# Patient Record
Sex: Male | Born: 1980 | Hispanic: Yes | Marital: Single | Smoking: Never smoker
Health system: Southern US, Community
[De-identification: ages and names within clinical notes are randomized; demographics above are authoritative.]

## PROBLEM LIST (undated history)

## (undated) DIAGNOSIS — E039 Hypothyroidism, unspecified: Secondary | ICD-10-CM

---

## 2011-09-05 ENCOUNTER — Emergency Department (HOSPITAL_COMMUNITY)
Admission: EM | Admit: 2011-09-05 | Discharge: 2011-09-05 | Disposition: A | Discharge status: Home / Self Care | Payer: Self-pay | Attending: Emergency Medicine | Admitting: Emergency Medicine

## 2011-09-05 ENCOUNTER — Encounter (HOSPITAL_COMMUNITY): Payer: Self-pay | Admitting: *Deleted

## 2011-09-05 DIAGNOSIS — E039 Hypothyroidism, unspecified: Secondary | ICD-10-CM | POA: Insufficient documentation

## 2011-09-05 DIAGNOSIS — T360X5A Adverse effect of penicillins, initial encounter: Secondary | ICD-10-CM | POA: Insufficient documentation

## 2011-09-05 DIAGNOSIS — R112 Nausea with vomiting, unspecified: Secondary | ICD-10-CM | POA: Insufficient documentation

## 2011-09-05 DIAGNOSIS — T887XXA Unspecified adverse effect of drug or medicament, initial encounter: Secondary | ICD-10-CM | POA: Insufficient documentation

## 2011-09-05 HISTORY — DX: Hypothyroidism, unspecified: E03.9

## 2011-09-05 MED ORDER — GI COCKTAIL ~~LOC~~
30.0000 mL | Freq: Once | ORAL | Status: AC
  Administered 2011-09-05: 30 mL via ORAL
  Filled 2011-09-05: qty 30

## 2011-09-05 MED ORDER — ONDANSETRON 4 MG PO TBDP
8.0000 mg | ORAL_TABLET | Freq: Once | ORAL | Status: AC
  Administered 2011-09-05: 8 mg via ORAL
  Filled 2011-09-05: qty 2

## 2011-09-05 MED ORDER — ACETAMINOPHEN 325 MG PO TABS
650.0000 mg | ORAL_TABLET | Freq: Once | ORAL | Status: AC
  Administered 2011-09-05: 650 mg via ORAL
  Filled 2011-09-05: qty 2

## 2011-09-05 MED ORDER — CLINDAMYCIN HCL 300 MG PO CAPS: 300.0000 mg | ORAL_CAPSULE | Freq: Four times a day (QID) | ORAL | Status: AC

## 2011-09-05 NOTE — Discharge Instructions (Signed)
Please followup with your doctor for continued outpatient treatment. Stop taking your penicillin and begin taking your new antibiotic, clindamycin.   Reaccin a las drogas, Programme researcher, broadcasting/film/video (Drug Reaction, GI Intolerance) El Programme researcher, broadcasting/film/video que siente podra deberse a los medicamentos que est consumiendo. A menudo el malestar estomacal por medicamentos se debe a una irritacin del [place]. Esto no suele ser Runner, broadcasting/film/video.  CAUSAS Algunos medicamentos pueden causar reacciones. Esto puede ser una erupcin leve, nuseas y vmitos o una reaccin que puede ser fatal denominada anafilaxis. La anafilaxis involucra a todo el cuerpo.  INSTRUCCIONES PARA EL CUIDADO DOMICILIARIO  Si el nico problema que tiene es Programme researcher, broadcasting/film/video, tomar la medicacin junto con algn alimento o algo en el estmago har que el problema desaparezca. Si esta solucin no funciona, deber ponerse en contacto con el mdico. Puede ser necesario cambiar la medicacin.   Esto no funcionar si la medicacin debe tomarse con el estmago vaco.  Romona Curls ATENCIN MDICA SI:  Desarrolla otros problemas que empeoran en vez de mejorar.   Desarrolla nuevos sntomas.   Reaparecen los sntomas que lo llevaron a la consulta con el profesional o la sala de Sports administrator.  SOLICITE ATENCIN MDICA INMEDIATAMENTE SI:  Presenta dificultad para respirar, jadea o tiene una sensacin de opresin en el pecho o en la garganta.   Tiene la boca hinchada, o presenta urticaria, hinchazn o picazn en todo el cuerpo. ESTO ES UNA EMERGENCIA LLAME AL 911.   Presenta diarrea o vmitos.   Se marea o pierde el conocimiento  ESTO ES UNA EMERGENCIA! LOS FAMILIARES O EL MDICO DEBEN LLAMAR AL 911.  EST SEGURO QUE:   Comprende las instrucciones para el alta mdica.   Controlar su enfermedad.   Solicitar atencin mdica de inmediato segn las indicaciones.  Document Released: 08/09/2008 Document Revised: 05/02/2011 [facility]  Patient Information 2012 [place], [place].

## 2011-09-05 NOTE — ED Provider Notes (Signed)
History     CSN: [id number]  Arrival date & time 09/05/11  2154   First MD Initiated Contact with Patient 09/05/11 2251      Chief Complaint  Patient presents with  . Emesis    HPI  History provided by the patient through a Spanish interpreter. Patient is a 31 year old Hispanic male with history of hypothyroidism who presents with complaints of nausea vomiting after taking penicillin. Patient reports being diagnosed with a throat infection 2 days ago. He was started on penicillin for this infection. He reports taking doses this morning shortly after feeling dizziness with nausea vomiting episode. Patient continues to feel a pressure in his chest and epigastric area. He reports symptoms are improving slowly. He denies any rash or itching. No swelling of the throat or shortness of breath. He denies any other complaints. Symptoms are described as moderate. There are no other aggravating or alleviating factors.    Past Medical History  Diagnosis Date  . Hypothyroidism     History reviewed. No pertinent past surgical history.  History reviewed. No pertinent family history.  History  Substance Use Topics  . Smoking status: Never Smoker   . Smokeless tobacco: Not on file  . Alcohol Use: No      Review of Systems  Constitutional: Positive for fever and chills. Negative for appetite change.  HENT: Positive for sore throat. Negative for congestion and rhinorrhea.   Respiratory: Negative for cough and shortness of breath.   Gastrointestinal: Positive for nausea and vomiting. Negative for abdominal pain.  Musculoskeletal: Negative for myalgias.  Skin: Negative for rash.    Allergies  Penicillins  Home Medications   Current Outpatient Rx  Name Route Sig Dispense Refill  . FA-PYRIDOXINE-CYANCOBALAMIN 2.5-25-2 MG PO TABS Oral Take 1 tablet by mouth daily.    Marland Kitchen LEVOTHYROXINE SODIUM 100 MCG PO TABS Oral Take 100 mcg by mouth daily.    Marland Kitchen PENICILLIN V POTASSIUM 500 MG PO TABS Oral  Take 500 mg by mouth 2 (two) times daily.      BP 113/77  Pulse 70  Temp(Src) 98.5 F (36.9 C) (Oral)  Resp 20  SpO2 94%  Physical Exam  Nursing note and vitals reviewed. Constitutional: He is oriented to person, place, and time. He appears well-developed and well-nourished. No distress.  HENT:  Head: Normocephalic and atraumatic.       Mild enlargement of bilateral tonsils with erythema. Uvula midline. No signs for PTA.  Neck: Normal range of motion. Neck supple.  Cardiovascular: Normal rate and regular rhythm.   Pulmonary/Chest: Effort normal and breath sounds normal. No respiratory distress. He has no rales.  Abdominal: Soft. He exhibits no distension. There is no tenderness. There is no rebound and no guarding.  Lymphadenopathy:    He has cervical adenopathy.  Neurological: He is alert and oriented to person, place, and time.  Skin: Skin is warm. No rash noted.  Psychiatric: He has a normal mood and affect. His behavior is normal.    ED Course  Procedures      1. Medication side effects   2. Nausea & vomiting       MDM  10:50 PM patient seen and evaluated. Patient in no acute distress.        Angus Seller, [place] 09/06/11 [phone]

## 2011-09-05 NOTE — ED Notes (Signed)
Assumed care of pt.  Pt denies Nausea at this time.  Reports that his throat is hurting a little.  No distress noted.  Family remains at bedside.

## 2011-09-05 NOTE — ED Notes (Signed)
Patient has started new meds for infection in his throat.  Pt had vomiting after medications.  No vomiting at this time.

## 2011-09-09 NOTE — ED Provider Notes (Signed)
Medical screening examination/treatment/procedure(s) were performed by non-physician practitioner and as supervising physician I was immediately available for consultation/collaboration.   Gwyneth Sprout, MD 09/09/11 2119

## 2013-07-18 ENCOUNTER — Ambulatory Visit: Payer: Self-pay | Admitting: Internal Medicine

## 2013-07-18 VITALS — BP 122/78 | HR 118 | Temp 98.9°F | Resp 18 | Ht 64.0 in | Wt 155.6 lb

## 2013-07-18 DIAGNOSIS — K529 Noninfective gastroenteritis and colitis, unspecified: Secondary | ICD-10-CM

## 2013-07-18 DIAGNOSIS — E039 Hypothyroidism, unspecified: Secondary | ICD-10-CM

## 2013-07-18 DIAGNOSIS — K5289 Other specified noninfective gastroenteritis and colitis: Secondary | ICD-10-CM

## 2013-07-18 DIAGNOSIS — A088 Other specified intestinal infections: Secondary | ICD-10-CM

## 2013-07-18 MED ORDER — DICYCLOMINE HCL 20 MG PO TABS: 20.0000 mg | ORAL_TABLET | Freq: Four times a day (QID) | ORAL | Status: DC

## 2013-07-18 MED ORDER — ONDANSETRON HCL 4 MG PO TABS: 4.0000 mg | ORAL_TABLET | Freq: Three times a day (TID) | ORAL | Status: AC | PRN

## 2013-07-18 MED ORDER — DICYCLOMINE HCL 20 MG PO TABS: 20.0000 mg | ORAL_TABLET | Freq: Four times a day (QID) | ORAL | Status: AC

## 2013-07-18 MED ORDER — ONDANSETRON HCL 4 MG PO TABS: 8.0000 mg | ORAL_TABLET | Freq: Once | ORAL | Status: AC

## 2013-07-19 DIAGNOSIS — E039 Hypothyroidism, unspecified: Secondary | ICD-10-CM | POA: Insufficient documentation

## 2013-07-19 NOTE — Progress Notes (Signed)
   Subjective:    Patient ID: Jonathan Johns, male    DOB: 12/29/1980, 33 y.o.   MRN: [id number]  HPI Sudden onset of nausea vomiting and diarrhea last night Had chills and fever as well but none this afternoon Continues to be nauseated and is unable to take much in today but no vomiting this afternoon Has continued diarrhea today Has some stomach cramping before the diarrhea but then the pain resolves No upper respiratory symptoms  Meds for hypothyroidism Review of Systems Noncontributory    Objective:   Physical Exam BP 122/78  Pulse 118  Temp(Src) 98.9 F (37.2 C) (Oral)  Resp 18  Ht 5\' 4"  (1.626 m)  Wt 155 lb 9.6 oz (70.58 kg)  BMI 26.70 kg/m2  SpO2 96% No acute distress HEENT clear Heart regular Lungs clear Abdomen soft with markedly increased bowel sounds No hepatomegaly or splenomegaly No masses Mild tenderness in the epigastrium and left lower quadrant Exam of the epigastrium caused him to vomit   Given Zofran 8 mg sublingual     Assessment & Plan:  Problem #1 gastroenteritis-  Plan Advance liquids in small amounts when nausea resolved by medication Continue Zofran every 8 hours as needed Use dicyclomine for cramping when necessary 4 times a day

## 2014-07-08 ENCOUNTER — Encounter (HOSPITAL_COMMUNITY): Payer: Self-pay

## 2014-07-08 ENCOUNTER — Telehealth: Payer: Self-pay | Admitting: *Deleted

## 2014-07-08 ENCOUNTER — Emergency Department (HOSPITAL_COMMUNITY)
Admission: EM | Admit: 2014-07-08 | Discharge: 2014-07-08 | Disposition: A | Discharge status: Home / Self Care | Payer: Self-pay | Attending: Emergency Medicine | Admitting: Emergency Medicine

## 2014-07-08 DIAGNOSIS — Z880 Allergy status to penicillin: Secondary | ICD-10-CM | POA: Insufficient documentation

## 2014-07-08 DIAGNOSIS — E039 Hypothyroidism, unspecified: Secondary | ICD-10-CM | POA: Insufficient documentation

## 2014-07-08 DIAGNOSIS — J029 Acute pharyngitis, unspecified: Secondary | ICD-10-CM | POA: Insufficient documentation

## 2014-07-08 DIAGNOSIS — Z79899 Other long term (current) drug therapy: Secondary | ICD-10-CM | POA: Insufficient documentation

## 2014-07-08 LAB — RAPID STREP SCREEN (MED CTR MEBANE ONLY): STREPTOCOCCUS, GROUP A SCREEN (DIRECT): NEGATIVE

## 2014-07-08 MED ORDER — DEXAMETHASONE 4 MG PO TABS
12.0000 mg | ORAL_TABLET | Freq: Once | ORAL | Status: AC
  Administered 2014-07-08: 12 mg via ORAL
  Filled 2014-07-08: qty 3

## 2014-07-08 MED ORDER — MAGIC MOUTHWASH
10.0000 mL | Freq: Once | ORAL | Status: AC
  Administered 2014-07-08: 10 mL via ORAL
  Filled 2014-07-08: qty 10

## 2014-07-08 MED ORDER — MAGIC MOUTHWASH: 5.0000 mL | Freq: Four times a day (QID) | ORAL | Status: AC | PRN

## 2014-07-08 NOTE — Telephone Encounter (Signed)
Pharmacy called for Magic Mouthwash concentration.

## 2014-07-08 NOTE — ED Provider Notes (Signed)
CSN: [id number]     Arrival date & time 07/08/14  0417 History   First MD Initiated Contact with Patient 07/08/14 [phone]     Chief Complaint  Patient presents with  . Sore Throat     (Consider location/radiation/quality/duration/timing/severity/associated sxs/prior Treatment) HPI Jonathan Johns is a 34 year-old male with pmhx of hypothyroidism who presents to the ER c/o sore throat.  Patient states his symptoms began 3 days ago, and have persisted. Patient states he was seen in an urgent care yesterday, prescribed Toradol and Sudafed, and these have not relieved his symptoms. He states he feels his throat is swelling, and he is having difficulty swallowing food due to the sensation of swelling. Patient denies nausea, vomiting, fever, shortness of breath.   Past Medical History  Diagnosis Date  . Hypothyroidism    History reviewed. No pertinent past surgical history. No family history on file. History  Substance Use Topics  . Smoking status: Never Smoker   . Smokeless tobacco: Not on file  . Alcohol Use: No    Review of Systems  Constitutional: Negative for fever.  HENT: Positive for sore throat and trouble swallowing.   Eyes: Negative for visual disturbance.  Respiratory: Negative for shortness of breath.   Cardiovascular: Negative for chest pain.  Gastrointestinal: Negative for nausea, vomiting and abdominal pain.  Genitourinary: Negative for dysuria.  Skin: Negative for rash.  Neurological: Negative for dizziness, syncope, weakness and numbness.  Psychiatric/Behavioral: Negative.       Allergies  Sulfa antibiotics and Penicillins  Home Medications   Prior to Admission medications   Medication Sig Start Date End Date Taking? Authorizing Provider  ketorolac (TORADOL) 10 MG tablet Take 10 mg by mouth every 6 (six) hours as needed for moderate pain.   Yes Historical Provider, MD  pseudoephedrine (SUDAFED) 120 MG 12 hr tablet Take 120 mg by mouth every 12 (twelve) hours  as needed for congestion.   Yes Historical Provider, MD  Alum & Mag Hydroxide-Simeth (MAGIC MOUTHWASH) SOLN Take 5 mLs by mouth 4 (four) times daily as needed for mouth pain. 07/08/14   Monte Fantasia, PA-C  dicyclomine (BENTYL) 20 MG tablet Take 1 tablet (20 mg total) by mouth every 6 (six) hours. As needed for cramping Patient not taking: Reported on 07/08/2014 07/18/13   Tonye Pearson, MD  folic acid-pyridoxine-cyancobalamin (FOLTX) 2.5-25-2 MG TABS Take 1 tablet by mouth daily.    Historical Provider, MD  levothyroxine (SYNTHROID, LEVOTHROID) 100 MCG tablet Take 100 mcg by mouth daily.    Historical Provider, MD  ondansetron (ZOFRAN) 4 MG tablet Take 1 tablet (4 mg total) by mouth every 8 (eight) hours as needed for nausea or vomiting. Patient not taking: Reported on 07/08/2014 07/18/13   Tonye Pearson, MD   BP 115/81 mmHg  Pulse 93  Temp(Src) 97.7 F (36.5 C) (Oral)  Resp 18  Ht  (1.676 m)  Wt 152 lb (68.947 kg)  BMI 24.55 kg/m2  SpO2 98% Physical Exam  Constitutional: He is oriented to person, place, and time. He appears well-developed and well-nourished. No distress.  HENT:  Head: Normocephalic and atraumatic.  Mouth/Throat: Uvula is midline and mucous membranes are normal. No trismus in the jaw. No uvula swelling. Posterior oropharyngeal erythema present. No oropharyngeal exudate, posterior oropharyngeal edema or tonsillar abscesses.  Mild erythema noted to posterior oropharynx. No tonsillar swelling or exudate noted. Uvula midline.no trismus.no anterior cervical lymphadenopathy or pain with movement of trachea. Patient has full range of motion  of his neck without stiffness or discomfort.  Eyes: Right eye exhibits no discharge. Left eye exhibits no discharge. No scleral icterus.  Neck: Normal range of motion.  Pulmonary/Chest: Effort normal. No accessory muscle usage. No tachypnea. No respiratory distress.  Musculoskeletal: Normal range of motion.  Neurological: He is  alert and oriented to person, place, and time.  Skin: Skin is warm and dry. He is not diaphoretic.  Psychiatric: He has a normal mood and affect.  Nursing note and vitals reviewed.   ED Course  Procedures (including critical care time) Labs Review Labs Reviewed  RAPID STREP SCREEN  CULTURE, GROUP A STREP    Imaging Review No results found.   EKG Interpretation None      MDM   Final diagnoses:  Pharyngitis    Patients symptoms are consistent with pharyngitis, likely viral etiology. On exam, no concern for PTA or retropharyngeal abscess, no concern for epiglottitis. Patient nontoxic, non-tachycardic, non-tachypneic, non-hypoxic, afebrile, well-appearing and in no acute distress. Patient able to tolerate by mouth liquids in the ER without difficulty and treated with Decadron in the ER to help reduce swelling. Discussed that antibiotics are not indicated for viral infections. Patient instructed to follow up with his primary care physician.Pt will be discharged with symptomatic treatment.  Verbalizes understanding and is agreeable with plan. Pt is hemodynamically stable & in NAD prior to dc.return precautions discussed, and patient encouraged to call or return to the ER should he have any questions or concerns.  BP 115/81 mmHg  Pulse 93  Temp(Src) 97.7 F (36.5 C) (Oral)  Resp 18  Ht 5\' 6"  (1.676 m)  Wt 152 lb (68.947 kg)  BMI 24.55 kg/m2  SpO2 98%  Signed,  Ladona MowJoe Britny Riel, PA-C 8:31 AM  Patient seen and discussed with Dr. Cy BlamerApril Palumbo, M.D.   Monte FantasiaJoseph W Lexianna Weinrich, PA-C 07/08/14 0831  April K Palumbo-Rasch, MD 07/08/14 [phone]

## 2014-07-08 NOTE — Discharge Instructions (Signed)
Faringitis (Pharyngitis) La faringitis ocurre cuando la faringe presenta enrojecimiento, dolor e hinchazn (inflamacin).  CAUSAS  Normalmente, la faringitis se debe a una infeccin. Generalmente, estas infecciones ocurren debido a virus (viral) y se presentan cuando las personas se resfran. Sin embargo, a veces la faringitis es provocada por bacterias (bacteriana). Las alergias tambin pueden ser una causa de la faringitis. La faringitis viral se puede contagiar de una persona a otra al toser, estornudar y compartir objetos o utensilios personales (tazas, tenedores, cucharas, cepillos de diente). La faringitis bacteriana se puede contagiar de una persona a otra a travs de un contacto ms ntimo, como besar.  SIGNOS Y SNTOMAS  Los sntomas de la faringitis incluyen los siguientes:   Dolor de garganta.  Cansancio (fatiga).  Fiebre no muy elevada.  Dolor de cabeza.  Dolores musculares y en las articulaciones.  Erupciones cutneas  Ganglios linfticos hinchados.  Una pelcula parecida a las placas en la garganta o las amgdalas (frecuente con la faringitis bacteriana). DIAGNSTICO  El mdico le har preguntas sobre la enfermedad y sus sntomas. Normalmente, todo lo que se necesita para diagnosticar una faringitis son sus antecedentes mdicos y un examen fsico. A veces se realiza una prueba rpida para estreptococos. Tambin es posible que se realicen otros anlisis de laboratorio, segn la posible causa.  TRATAMIENTO  La faringitis viral normalmente mejorar en un plazo de 3 a 4das sin medicamentos. La faringitis bacteriana se trata con medicamentos que matan los grmenes (antibiticos).  INSTRUCCIONES PARA EL CUIDADO EN EL HOGAR   Beba gran cantidad de lquido para mantener la orina de tono claro o color amarillo plido.  Tome solo medicamentos de venta libre o recetados, segn las indicaciones del mdico.  Si le receta antibiticos, asegrese de terminarlos, incluso si comienza  a sentirse mejor.  No tome aspirina.  Descanse lo suficiente.  Hgase grgaras con 8onzas (227ml) de agua con sal (cucharadita de sal por litro de agua) cada 1 o 2horas para calmar la garganta.  Puede usar pastillas (si no corre riesgo de ahogarse) o aerosoles para calmar la garganta. SOLICITE ATENCIN MDICA SI:   Tiene bultos grandes y dolorosos en el cuello.  Tiene una erupcin cutnea.  Cuando tose elimina una expectoracin verde, amarillo amarronado o con sangre. SOLICITE ATENCIN MDICA DE INMEDIATO SI:   El cuello se pone rgido.  Comienza a babear o no puede tragar lquidos.  Vomita o no puede retener los medicamentos ni los lquidos.  Siente un dolor intenso que no se alivia con los medicamentos recomendados.  Tiene dificultades para respirar (y no debido a la nariz tapada). ASEGRESE DE QUE:   Comprende estas instrucciones.  Controlar su afeccin.  Recibir ayuda de inmediato si no mejora o si empeora. Document Released: 02/20/2005 Document Revised: 03/03/2013 [employer] Patient Information 2015 [employer]. This information is not intended to replace advice given to you by your health care provider. Make sure you discuss any questions you have with your health care provider.  

## 2014-07-08 NOTE — ED Notes (Signed)
Pt states that his throat has been hurting since wed night, yesterday went to urgent care and was prescribed sudafed and ketorolac,pt states that he still feels like his throat is closing up and is unable to swallow food.

## 2014-07-09 ENCOUNTER — Encounter (HOSPITAL_COMMUNITY): Payer: Self-pay | Admitting: Emergency Medicine

## 2014-07-09 ENCOUNTER — Emergency Department (HOSPITAL_COMMUNITY)
Admission: EM | Admit: 2014-07-09 | Discharge: 2014-07-09 | Disposition: A | Discharge status: Home / Self Care | Payer: Self-pay | Attending: Emergency Medicine | Admitting: Emergency Medicine

## 2014-07-09 ENCOUNTER — Emergency Department (HOSPITAL_COMMUNITY): Payer: Self-pay

## 2014-07-09 DIAGNOSIS — Z880 Allergy status to penicillin: Secondary | ICD-10-CM | POA: Insufficient documentation

## 2014-07-09 DIAGNOSIS — J029 Acute pharyngitis, unspecified: Secondary | ICD-10-CM | POA: Insufficient documentation

## 2014-07-09 DIAGNOSIS — E039 Hypothyroidism, unspecified: Secondary | ICD-10-CM | POA: Insufficient documentation

## 2014-07-09 DIAGNOSIS — Z79899 Other long term (current) drug therapy: Secondary | ICD-10-CM | POA: Insufficient documentation

## 2014-07-09 MED ORDER — IBUPROFEN 600 MG PO TABS: 600.0000 mg | ORAL_TABLET | Freq: Four times a day (QID) | ORAL | Status: AC | PRN

## 2014-07-09 MED ORDER — KETOROLAC TROMETHAMINE 30 MG/ML IJ SOLN
30.0000 mg | Freq: Once | INTRAMUSCULAR | Status: AC
  Administered 2014-07-09: 30 mg via INTRAVENOUS
  Filled 2014-07-09: qty 1

## 2014-07-09 MED ORDER — PREDNISONE 20 MG PO TABS: ORAL_TABLET | ORAL | Status: AC

## 2014-07-09 MED ORDER — IOHEXOL 300 MG/ML  SOLN
80.0000 mL | Freq: Once | INTRAMUSCULAR | Status: AC | PRN
  Administered 2014-07-09: 80 mL via INTRAVENOUS

## 2014-07-09 MED ORDER — METHYLPREDNISOLONE SODIUM SUCC 125 MG IJ SOLR
125.0000 mg | Freq: Once | INTRAMUSCULAR | Status: AC
  Administered 2014-07-09: 125 mg via INTRAVENOUS
  Filled 2014-07-09: qty 2

## 2014-07-09 NOTE — ED Notes (Signed)
Pt. reports persistent sore throat , seen here today diagnosed with pharyngitis prescribed with medication with no relief , airway intact / respirations unlabored .

## 2014-07-09 NOTE — ED Notes (Signed)
Patient is alert and orientedx4.  Patient was explained discharge instructions and they understood them with no questions.   

## 2014-07-09 NOTE — ED Provider Notes (Signed)
CSN: [id number]     Arrival date & time 07/09/14  0111 History   This chart was scribed for Loren Raceravid Dossie Swor, MD by Abel PrestoKara Demonbreun, ED Scribe. This patient was seen in room A07C/A07C and the patient's care was started at 2:04 AM.    Chief Complaint  Patient presents with  . Sore Throat      Patient is a 34 y.o. male presenting with pharyngitis. The history is provided by the patient and a significant other. No language interpreter was used.  Sore Throat Pertinent negatives include no shortness of breath.    HPI Comments: Jonathan Johns is a 34 y.o. male who presents to the Emergency Department complaining of sore throat with onset 4 days ago. Pt's significant other is here to translate. Pt is unable to tolerate foods though he tolerates liquids. He notes associated mild fever and throat swelling. Pt was seen in the ED yesterday morning for same symptoms. Diagnosed with viral pharyngitis. Pt denies cough, rash, and rhinorrhea.   Past Medical History  Diagnosis Date  . Hypothyroidism    History reviewed. No pertinent past surgical history. No family history on file. History  Substance Use Topics  . Smoking status: Never Smoker   . Smokeless tobacco: Not on file  . Alcohol Use: No    Review of Systems  Constitutional: Positive for fever. Negative for chills.  HENT: Positive for sore throat and trouble swallowing. Negative for congestion, rhinorrhea and voice change.   Respiratory: Negative for cough, shortness of breath, wheezing and stridor.   Gastrointestinal: Negative for nausea and vomiting.  Skin: Negative for rash.  All other systems reviewed and are negative.     Allergies  Sulfa antibiotics and Penicillins  Home Medications   Prior to Admission medications   Medication Sig Start Date End Date Taking? Authorizing Provider  Alum & Mag Hydroxide-Simeth (MAGIC MOUTHWASH) SOLN Take 5 mLs by mouth 4 (four) times daily as needed for mouth pain. 07/08/14  Yes Monte FantasiaJoseph W  Mintz, PA-C  ketorolac (TORADOL) 10 MG tablet Take 10 mg by mouth every 6 (six) hours as needed for moderate pain.   Yes Historical Provider, MD  pseudoephedrine (SUDAFED) 120 MG 12 hr tablet Take 120 mg by mouth every 12 (twelve) hours as needed for congestion.   Yes Historical Provider, MD  dicyclomine (BENTYL) 20 MG tablet Take 1 tablet (20 mg total) by mouth every 6 (six) hours. As needed for cramping Patient not taking: Reported on 07/08/2014 07/18/13   Tonye Pearsonobert P Doolittle, MD  folic acid-pyridoxine-cyancobalamin (FOLTX) 2.5-25-2 MG TABS Take 1 tablet by mouth daily.    Historical Provider, MD  ibuprofen (ADVIL,MOTRIN) 600 MG tablet Take 1 tablet (600 mg total) by mouth every 6 (six) hours as needed. 07/09/14   Loren Raceravid Hudson Majkowski, MD  levothyroxine (SYNTHROID, LEVOTHROID) 100 MCG tablet Take 100 mcg by mouth daily.    Historical Provider, MD  ondansetron (ZOFRAN) 4 MG tablet Take 1 tablet (4 mg total) by mouth every 8 (eight) hours as needed for nausea or vomiting. Patient not taking: Reported on 07/08/2014 07/18/13   Tonye Pearsonobert P Doolittle, MD  predniSONE (DELTASONE) 20 MG tablet 3 tabs po day one, then 2 tabs daily x 4 days 07/09/14   Loren Raceravid Arnett Duddy, MD   BP 139/96 mmHg  Pulse 97  Temp(Src) 98.1 F (36.7 C) (Oral)  Resp 18  SpO2 97% Physical Exam  Constitutional: He is oriented to person, place, and time. He appears well-developed and well-nourished. No distress.  No acute  distress. Speaking in normal voice  HENT:  Head: Normocephalic and atraumatic.  Mouth/Throat: Oropharynx is clear and moist. No oropharyngeal exudate.  Erythematous oropharynx without obvious swelling. No exudates  Eyes: Conjunctivae and EOM are normal. Pupils are equal, round, and reactive to light.  Neck: Normal range of motion. Neck supple.  Cardiovascular: Normal rate and regular rhythm.  Exam reveals no gallop and no friction rub.   No murmur heard. Pulmonary/Chest: Effort normal and breath sounds normal. No stridor. No  respiratory distress. He has no wheezes. He has no rales. He exhibits no tenderness.  Abdominal: Soft. Bowel sounds are normal.  Musculoskeletal: Normal range of motion. He exhibits no edema or tenderness.  Lymphadenopathy:    He has no cervical adenopathy.  Neurological: He is alert and oriented to person, place, and time.  Skin: Skin is warm and dry. No rash noted. No erythema.  Psychiatric: He has a normal mood and affect. His behavior is normal.  Nursing note and vitals reviewed.   ED Course  Procedures (including critical care time) DIAGNOSTIC STUDIES: Oxygen Saturation is 96% on room air, normal by my interpretation.    COORDINATION OF CARE: 2:07 AM Discussed treatment plan with patient at beside, the patient agrees with the plan and has no further questions at this time.   Labs Review Labs Reviewed - No data to display  Imaging Review Ct Soft Tissue Neck W Contrast  07/09/2014   CLINICAL DATA:  Unable to eat, states throat closes up. Difficulty swallowing. Diagnosed with pharyngitis today, a symptoms unchanged with medication.  EXAM: CT NECK WITH CONTRAST  TECHNIQUE: Multidetector CT imaging of the neck was performed using the standard protocol following the bolus administration of intravenous contrast.  CONTRAST:  80mL OMNIPAQUE IOHEXOL 300 MG/ML  SOLN  COMPARISON:  None.  FINDINGS: Pharynx and larynx: Motion limits evaluation of the the larynx. Otherwise unremarkable.  Salivary glands: Normal.  Thyroid: Normal.  Lymph nodes: None.  Vascular: Normal.  Limited intracranial: Normal.  Visualized orbits: Normal.  Mastoids and visualized paranasal sinuses: Well aerated.  Skeleton: Normal.  Upper chest: Clear.  IMPRESSION: Motion degraded evaluation limits assessment of the larynx with otherwise unremarkable CT of the neck.   Electronically Signed   By: Awilda Metro   On: 07/09/2014 03:29     EKG Interpretation None      MDM   Final diagnoses:  Sore throat    I personally  performed the services described in this documentation, which was scribed in my presence. The recorded information has been reviewed and is accurate.  Patient remains nontoxic appearing in the emergency department. No airway compromise. Voice is clear. CT of the neck without any acute findings. Discussed with patient and his significant significant other. Will start on short course of steroids and NSAIDs. He's been given return precautions and is voiced understanding.    Loren Racer, MD 07/09/14 [phone]

## 2014-07-09 NOTE — Discharge Instructions (Signed)
Take medications as prescribed. Continue to use mouth wash. Return immediately for worsening symptoms, difficulty breathing, voice changes or for any concerns.   Dolor de garganta  (Sore Throat)  El dolor de garganta es el dolor, ardor, irritacin o sensacin de picazn en la garganta. Generalmente hay dolor o molestias al tragar o hablar. Un dolor de garganta puede estar acompaado de otros sntomas, como tos, estornudos, fiebre y ganglios hinchados en el cuello. Generalmente es Financial risk analystel primer signo de otra enfermedad, como un resfrio, gripe, anginas o mononucleosis (conocida como mono). La mayor parte de los dolores de garganta desaparecen sin tratamiento mdico. CAUSAS  Las causas ms comunes de dolor de garganta son:   Infecciones virales, como un resfrio, gripe o mononucleosis.  Infeccin bacteriana, como faringitis estreptoccica, amigdalitis, o tos ferina.  Alergias estacionales.  La sequedad en el aire.  Algunos irritantes, como el humo o la polucin.  Reflujo gastroesofgico. INSTRUCCIONES PARA EL CUIDADO EN EL HOGAR   Tome slo la medicacin que le indic el mdico.  Debe ingerir gran cantidad de lquido para mantener la orina de tono claro o color amarillo plido.  Descanse todo lo que sea necesario.  Trate de usar Unisys Corporationaerosoles para la garganta, pastillas o chupe caramelos duros para Engineer, materialsaliviar el dolor (si es mayor de 4 aos o segn lo que le indiquen).  Beba lquidos calientes, como caldos, infusiones de hierbas o agua caliente con miel para calmar el dolor momentneamente. Tambin puede comer o beber lquidos fros o congelados tales como paletas de hielo congelado.  Haga grgaras con agua con sal (mezclar 1 cucharadita de sal en 8 onzas [250 cm3] de agua).  No fume, y evite el humo de otros fumadores.  Ponga un humidificador de vapor fro en la habitacin por la noche para humedecer el aire. Tambin se puede activar en una ducha de agua caliente y sentarse en el bao con la  puerta cerrada durante 5-10 minutos. SOLICITE ATENCIN MDICA DE INMEDIATO SI:   Tiene dificultad para respirar.  No puede tragar lquidos, alimentos blandos, o su saliva.  Usted tiene ms inflamacin en la garganta.  El dolor de garganta no mejora en [address] das.  Tiene nuseas o vmitos.  Tiene fiebre o sntomas que persisten durante ms de 2 o 3 das.  Tiene fiebre y los sntomas empeoran de manera sbita. ASEGRESE DE QUE:   Comprende estas instrucciones.  Controlar su enfermedad.  Solicitar ayuda de inmediato si no mejora o si empeora. Document Released: 05/13/2005 Document Revised: 04/29/2012 [facility]Exit[facility] Patient Information 2015 [place], [place]. This information is not intended to replace advice given to you by your health care provider. Make sure you discuss any questions you have with your health care provider.

## 2014-07-10 LAB — CULTURE, GROUP A STREP

## 2015-10-30 IMAGING — CT CT NECK W/ CM
4 of 5 series · 16 of 33 positions shown, 18 images · IV contrast (omnipaque)
Comparison: None.

CLINICAL DATA: Unable to eat, states throat closes up. Difficulty
swallowing. Diagnosed with pharyngitis today, a symptoms unchanged
with medication.

EXAM:
CT NECK WITH CONTRAST
TECHNIQUE: Multidetector CT imaging of the neck was performed using the
standard protocol following the bolus administration of intravenous
contrast.
CONTRAST:  80mL OMNIPAQUE IOHEXOL 300 MG/ML  SOLN

[Series 2: neck 2.0 i31s 3 · axial · 0.49mm/px · z∈[-316,-180]mm · 4 of 114 slices shown, 5 images]
[im 23/114  soft-tissue]
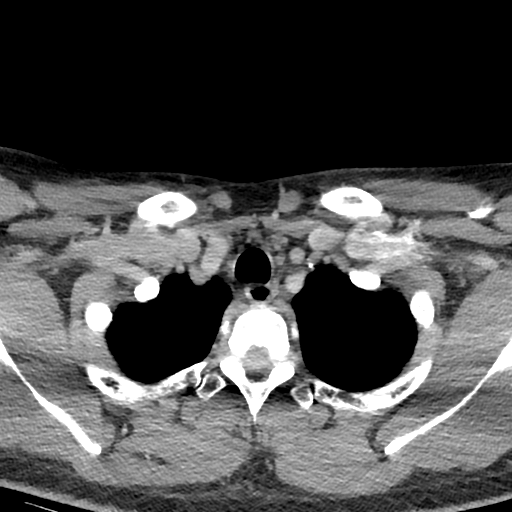
[im 23/114  bone]
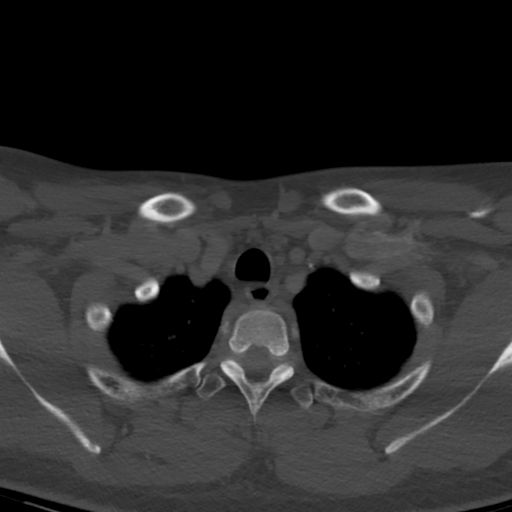
[im 46/114  bone]
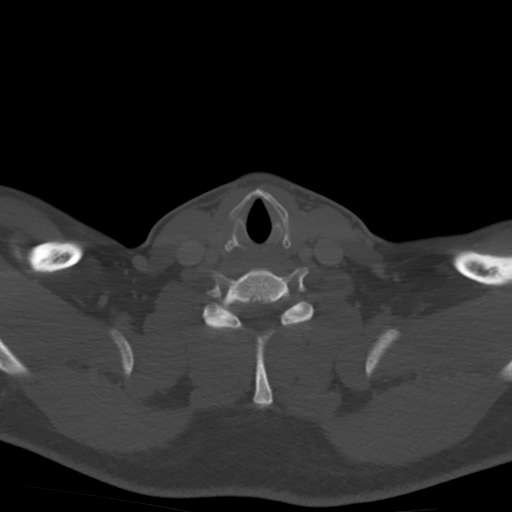
[im 68/114  bone]
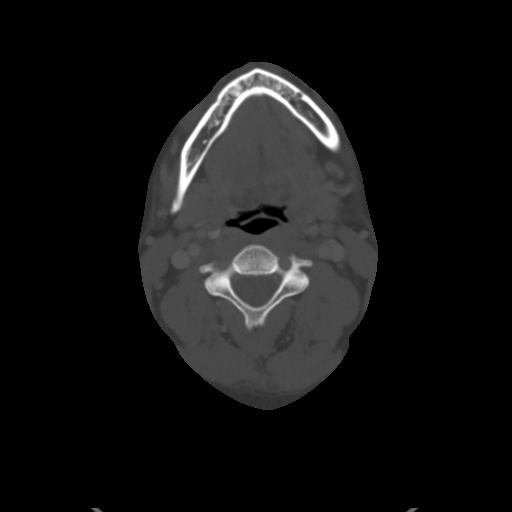
[im 91/114  bone]
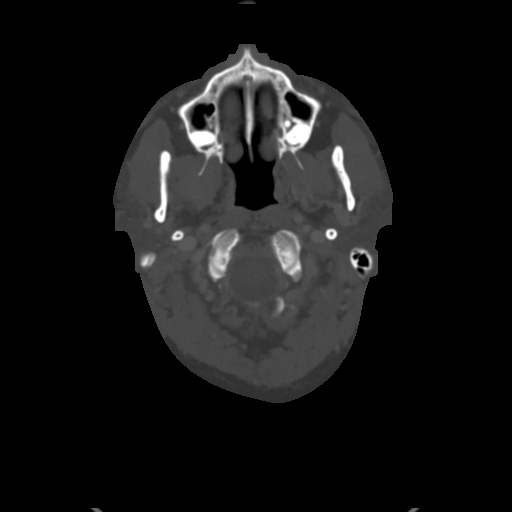

[Series 5: coronal st · coronal · 0.43mm/px · 3 of 105 slices shown]
[im 21/105  bone]
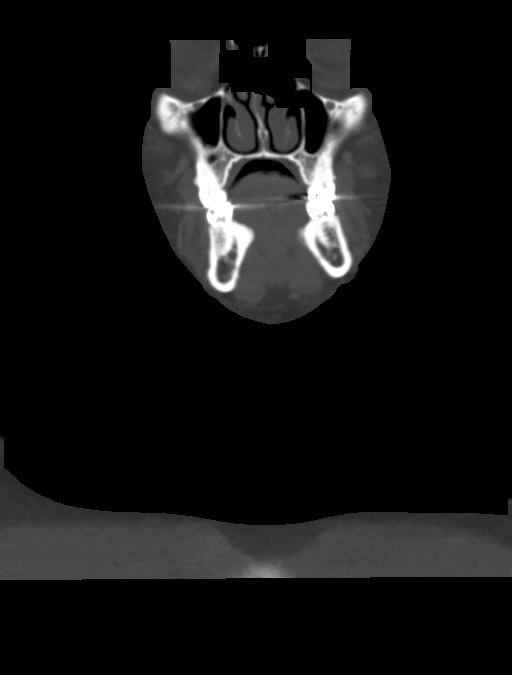
[im 42/105  bone]
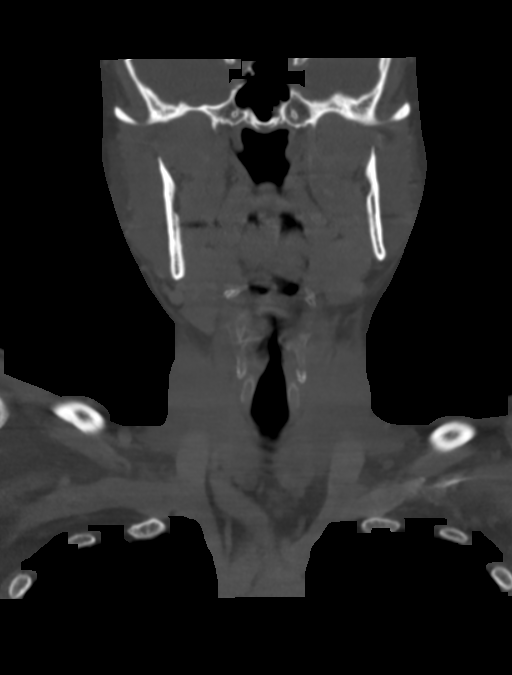
[im 63/105  bone]
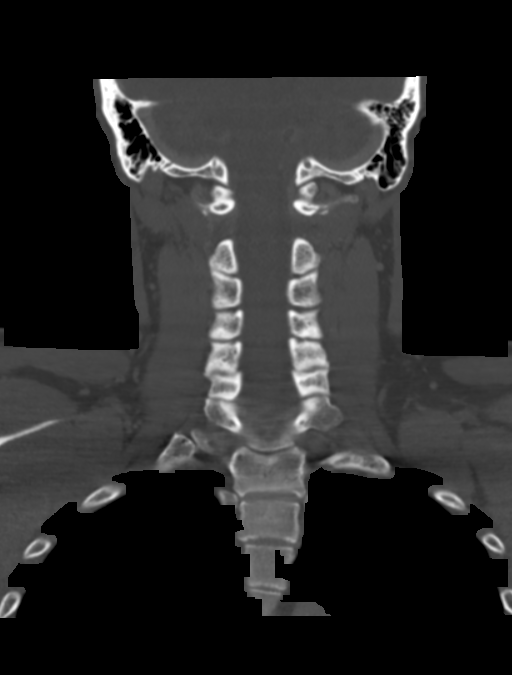

[Series 6: sagittal st · sagittal · 0.49mm/px · 5 of 82 slices shown, 6 images]
[im 28/82  bone]
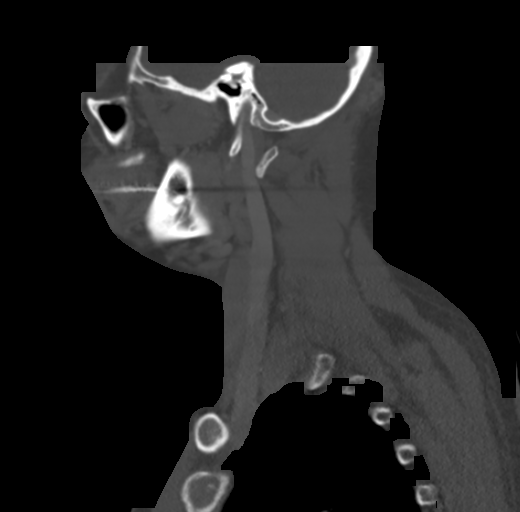
[im 34/82  bone]
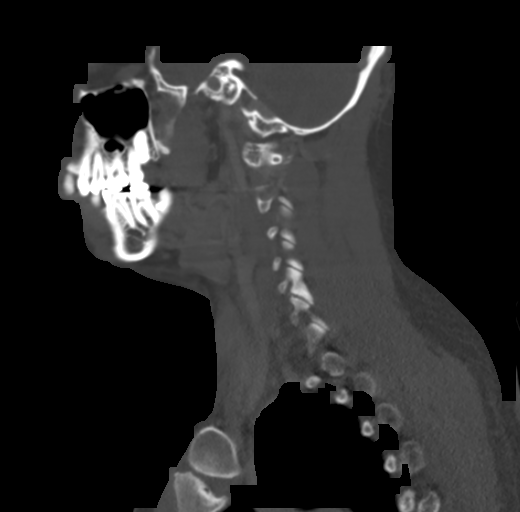
[im 41/82  soft-tissue]
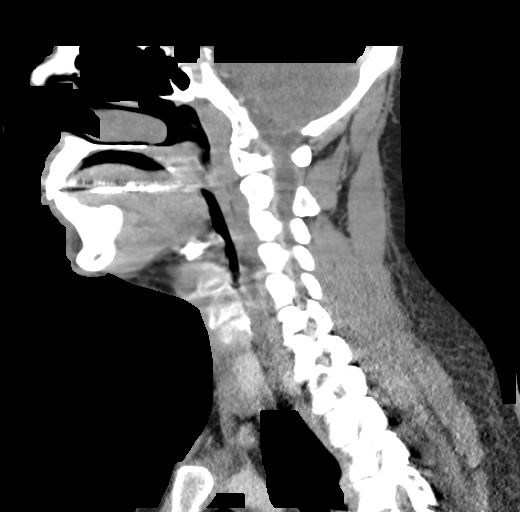
[im 41/82  bone]
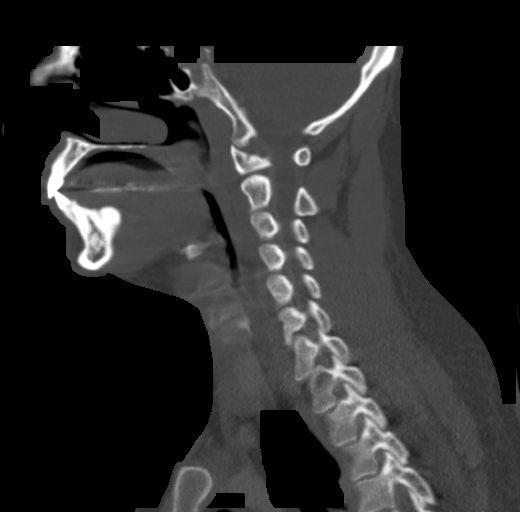
[im 48/82  bone]
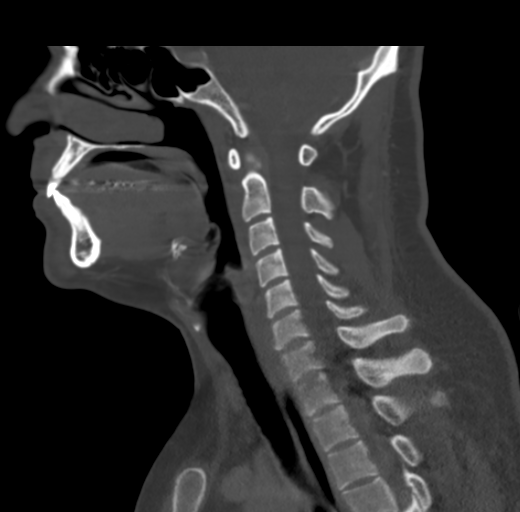
[im 55/82  bone]
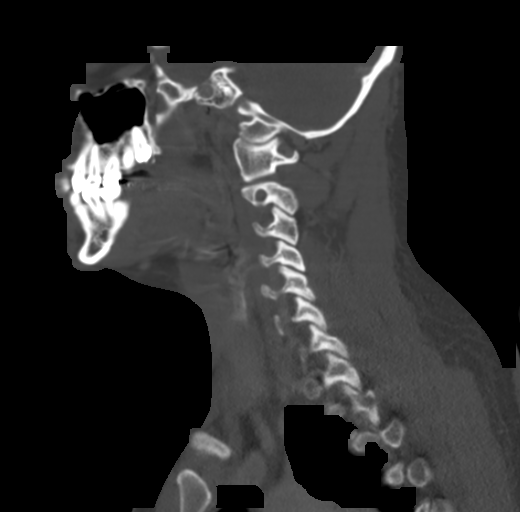

[Series 7: orthogonal st · axial · 0.39mm/px · z∈[-327,-202]mm · 4 of 111 slices shown]
[im 23/111  bone]
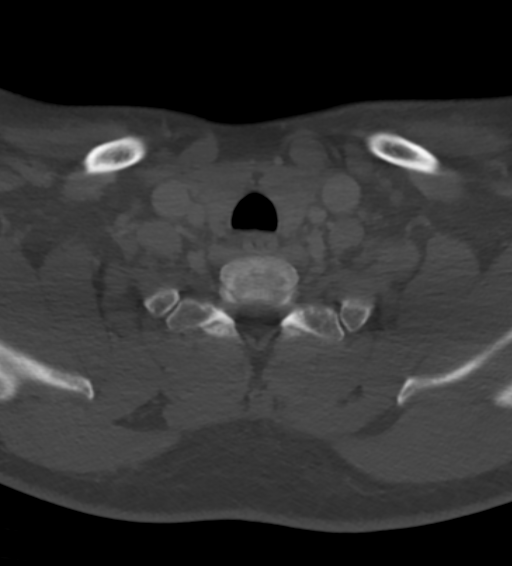
[im 45/111  bone]
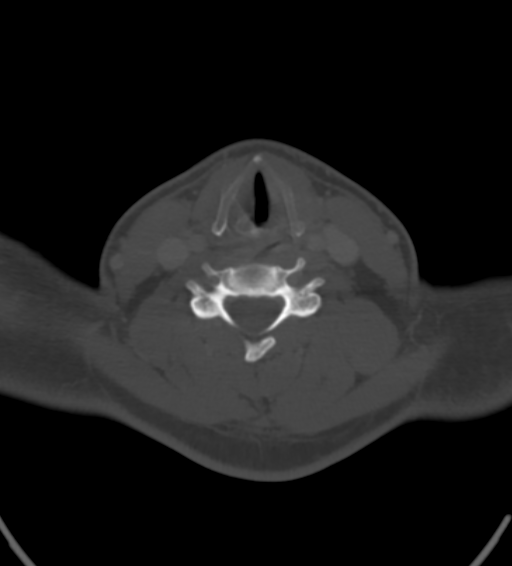
[im 67/111  bone]
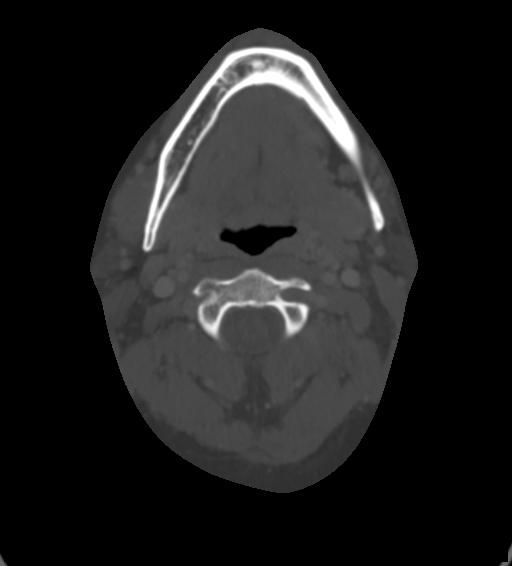
[im 89/111  bone]
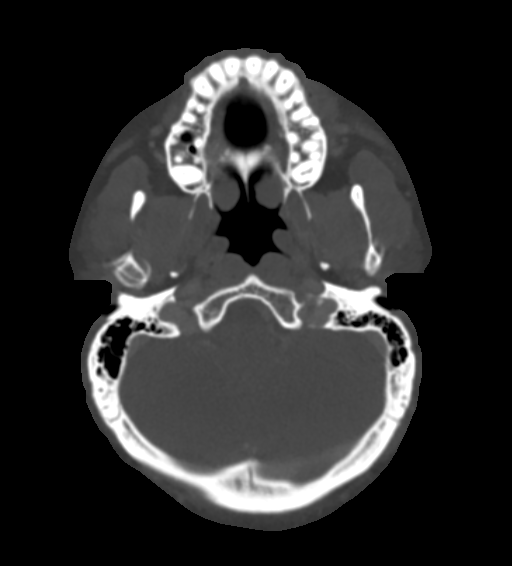

[16 of 33 positions shown; findings below may reference images not displayed]

FINDINGS: Pharynx and larynx: Motion limits evaluation of the the larynx.
Otherwise unremarkable.

Salivary glands: Normal.

Thyroid: Normal.

Lymph nodes: None.

Vascular: Normal.

Limited intracranial: Normal.

Visualized orbits: Normal.

Mastoids and visualized paranasal sinuses: Well aerated.

Skeleton: Normal.

Upper chest: Clear.
IMPRESSION: Motion degraded evaluation limits assessment of the larynx with
otherwise unremarkable CT of the neck.

  By: Nomasibulele Moatshe
